# Patient Record
Sex: Female | Born: 1990 | Race: White | Hispanic: No | Marital: Single
Health system: Southern US, Community
[De-identification: ages and names within clinical notes are randomized; demographics above are authoritative.]

---

## 2014-03-08 ENCOUNTER — Ambulatory Visit: Payer: Self-pay | Admitting: Family Medicine

## 2014-03-08 ENCOUNTER — Observation Stay: Payer: Self-pay | Admitting: Surgery

## 2014-03-08 LAB — PREGNANCY, URINE: Pregnancy Test, Urine: NEGATIVE m[IU]/mL

## 2014-03-10 LAB — PATHOLOGY REPORT

## 2014-12-03 NOTE — H&P (Signed)
History of Present Illness [age] yof, [employer] student, who began having epigastric pain Sunday [facility]. Able to sleep Sunday PM, but not last PM. Now pain has migrated to RLQ. No anorexia, no nausea, no fever. Last meal was yesterday.   ALLERGIES:  No Known Allergies:   HOME MEDICATIONS: Medication Instructions Status  Kariva biphasic oral tablet 1 tab(s) orally once a day Active   Family and Social History:  Family History Non-Contributory   Social History negative tobacco, negative ETOH, negative Illicit drugs, [place], single, she has spoken to her parents   Place of Living Home   Review of Systems:  Fever/Chills No   Cough No   Sputum No   Abdominal Pain Yes   Diarrhea No   Constipation No   Nausea/Vomiting No   SOB/DOE No   Chest Pain No   Dysuria No   Tolerating PT Yes   Tolerating Diet Yes   Medications/Allergies Reviewed Medications/Allergies reviewed   Physical Exam:  GEN well developed, well nourished, no acute distress   HEENT pink conjunctivae, PERRL, hearing intact to voice, Oropharynx clear, good dentition   NECK supple   RESP normal resp effort  clear BS  no use of accessory muscles   CARD regular rate  no murmur  no JVD  no Rub   ABD positive tenderness  mild pain, RLQ, no rebound, no guarding   LYMPH negative neck   EXTR negative cyanosis/clubbing, negative edema   SKIN normal to palpation, skin turgor poor   NEURO cranial nerves intact, negative tremor, follows commands, motor/sensory function intact   PSYCH alert, A+O to time, place, person, good insight   Radiology Results: LabUnknown:    28-Jul-15 14:39, CT Abdomen and Pelvis With Contrast  PACS Image  CT:  CT Abdomen and Pelvis With Contrast  REASON FOR EXAM:    Call Report  [id number]  RLQ pain x 2 days with mild   guarding elev white count  COMMENTS:       PROCEDURE: CT  - CT ABDOMEN / PELVIS  W  - Mar 08 2014  2:39PM     CLINICAL DATA:  Right  lower quadrant abdominal pain, guarding, and  elevated white blood cell count.    EXAM:  CT ABDOMEN AND PELVIS WITH CONTRAST    TECHNIQUE:  Multidetector CT imaging of the abdomen and pelvis was performed  using the standard protocol following bolus administration of  intravenous contrast.    CONTRAST:  100 cc of Isovue-300 intravenously. The patient also  received oral contrast material.    COMPARISON:  None    FINDINGS:  The appendix is inflamed and edematous. The maximal measured  diameter i s 13 mm. There is increased density in the surrounding  fat. There is no free fluid or abscess. There is no small or large  bowel obstruction. There are numerous normal-sized to minimally  enlarged mesenteric lymph nodes.    The liver, gallbladder, pancreas, spleen, adrenal glands, and  kidneys arenormal. The caliber of the abdominal aorta is normal.  Within the pelvis the urinary bladder and uterus and adnexal  structures are within the limits of normal for age. There is no  inguinal nor umbilical hernia.    The lung bases are clear. The lumbar spine and bony pelvis are  unremarkable.     IMPRESSION:  The findings are consistent with acute appendicitis without abscess  formation or perforation.    These results will be called to  the ordering clinician or  representative by the Radiology Department at the imaging location.  Electronically Signed    By: David  [place]    On: 03/08/2014 14:48         Verified By: DAVID A. [place], M.D., MD    Assessment/Admission Diagnosis Acute appendicitis   Plan Lap appy   Electronic Signatures: Claude Manges (MD)  (Signed 28-Jul-15 16:46)  Authored: CHIEF COMPLAINT and HISTORY, ALLERGIES, HOME MEDICATIONS, FAMILY AND SOCIAL HISTORY, REVIEW OF SYSTEMS, PHYSICAL EXAM, Radiology, ASSESSMENT AND PLAN   Last Updated: 28-Jul-15 16:46 by Claude Manges (MD)

## 2014-12-03 NOTE — Op Note (Signed)
PATIENT NAME:  Deanna IvoryROBERTS, Velvia A MR#:  [id number] DATE OF BIRTH:  10-05-1990  DATE OF PROCEDURE:  03/08/2014  PREOPERATIVE DIAGNOSIS: Acute appendicitis.   POSTOPERATIVE DIAGNOSIS: Acute early appendicitis.   PROCEDURE PERFORMED: Laparoscopic cholecystectomy.   SURGEON: Raynald KempMark A Rihana Kiddy, M.D.   ASSISTANT: Scrub tech.   TYPE OF ANESTHESIA: General endotracheal.   FINDINGS: Acute appendicitis, early. No apparent evidence of perforation. Normal tubes and ovaries. Normal terminal ileum and liver.   DESCRIPTION OF PROCEDURE: With informed consent, supine position, general endotracheal anesthesia, the patient's abdomen was widely prepped and draped with ChloraPrep solution. Timeout was observed. A 12 mm blunt Hasson trocar was placed through an open technique through an infraumbilical transversely oriented skin incision, with stay sutures being passed through the fascia. Pneumoperitoneum was established. The patient was then positioned in Trendelenburg and planned right side up. A 5 mm bladeless trocar was placed in the right upper quadrant. A 5 mm bladeless trocar was placed in the left lower quadrant.   The appendix was  retrocecal in nature. 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 The base of the appendix was identified. The mesoappendix was then sequentially taken with small bites of the Harmonic scalpel apparatus. A window was fashioned between the appendiceal artery and the base of the appendix. This was taken with the harmonic scalpel with advanced hemostasis feature. The confluence of the tinea was identified. The base of the appendix was then transected off the cecum at the confluence of the tinea, utilizing an endoscopic 35 mm blue load application of the endoscopic stapler. Hemostasis was then obtained on the staple line with point cautery and the application of a small piece of Surgicel. The specimen was captured and  retrieved through the infraumbilical port site.   Pneumoperitoneum was then re-established, the right lower quadrant being irrigated with a total of 1 liter of normal saline and aspirated dry. Generalized laparoscopic evaluation of the abdomen demonstrated normal tubes and ovaries, both the right and left, normal-appearing uterus, normal-appearing left and right lobe of the liver, normal-appearing terminal ileum and cecum.   The ports were then removed under direct visualization. A total of 30 mL of 0.25% plain Marcaine was infiltrated along all skin and fascial incisions prior to closure. The infraumbilical fascial defect was closed with an additional figure-of-eight #0 Vicryl suture in vertical orientation. Stay sutures being tied to each other, 4-0 Vicryl subcuticular was applied to all skin edges, followed by benzoin, Steri-Strips, Telfa and Tegaderm. The patient was then subsequently extubated and taken to the recovery room in stable and satisfactory condition by anesthesia services.     ____________________________ Redge GainerMark A. Egbert GaribaldiBird, MD mab:cg D: 03/09/2014 01:10:43 ET T: 03/09/2014 02:01:30 ET JOB#: [id number]  cc: Loraine LericheMark A. Egbert GaribaldiBird, MD, <Dictator> Keeghan Mcintire A Errin Whitelaw MD ELECTRONICALLY SIGNED 03/09/2014 7:08

## 2015-02-02 IMAGING — CT CT ABD-PELV W/ CM
2 of 4 series · 17 of 46 positions shown, 19 images · IV contrast (agent unspecified)
Comparison: None

CLINICAL DATA: Right lower quadrant abdominal pain, guarding, and
elevated white blood cell count.

EXAM:
CT ABDOMEN AND PELVIS WITH CONTRAST
TECHNIQUE: Multidetector CT imaging of the abdomen and pelvis was performed
using the standard protocol following bolus administration of
intravenous contrast.
CONTRAST:  100 cc of Zsovue-HSS intravenously. The patient also
received oral contrast material.

[Series 2: routine abd pel with · axial · 0.62mm/px · z∈[-935,-570]mm · 14 of 81 slices shown, 16 images]
[im 4/81  soft-tissue]
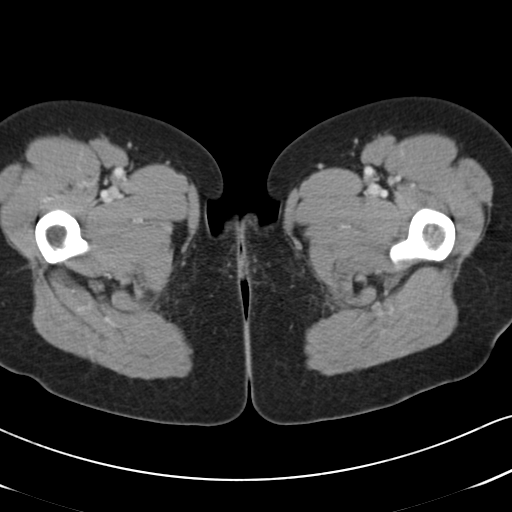
[im 4/81  bone]
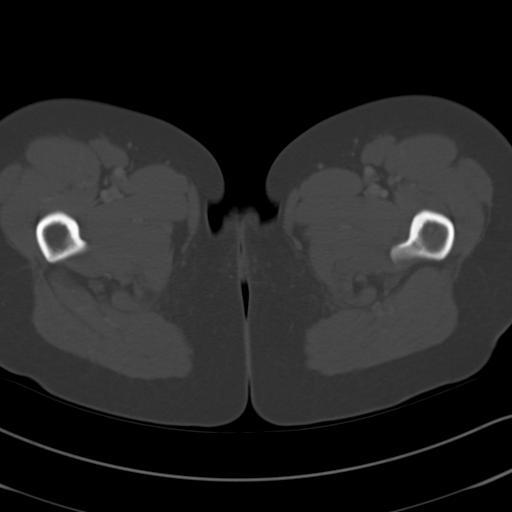
[im 11/81  soft-tissue]
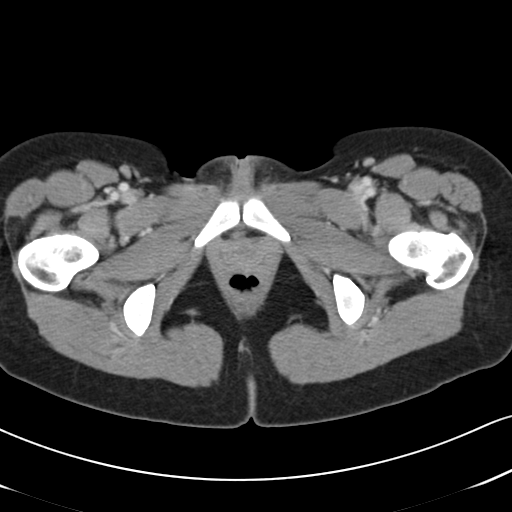
[im 17/81  soft-tissue]
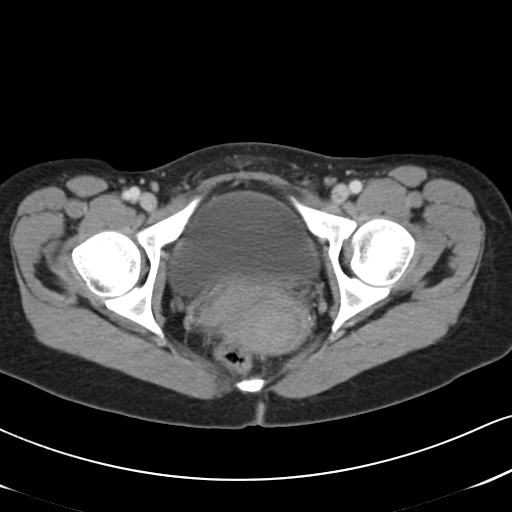
[im 21/81  soft-tissue]
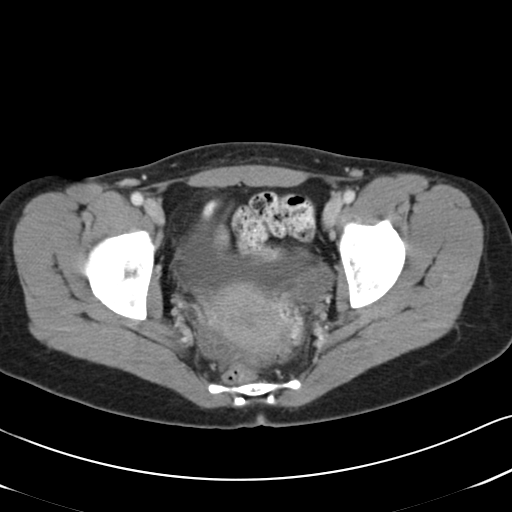
[im 27/81  soft-tissue]
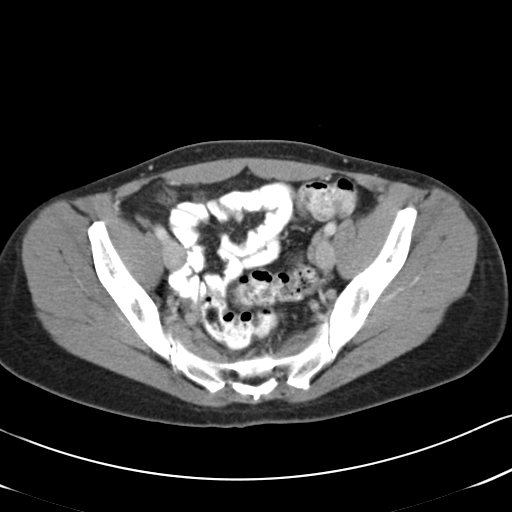
[im 34/81  soft-tissue]
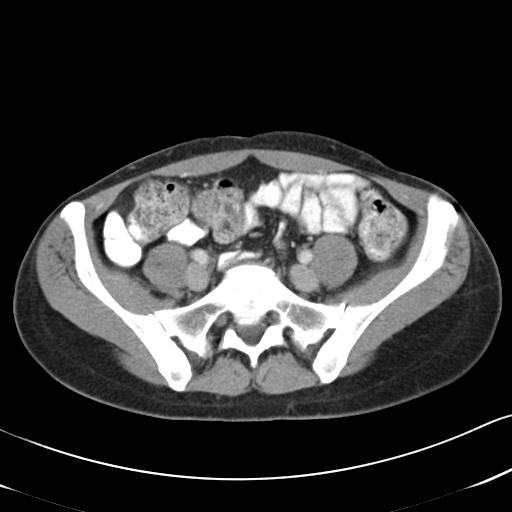
[im 37/81  soft-tissue]
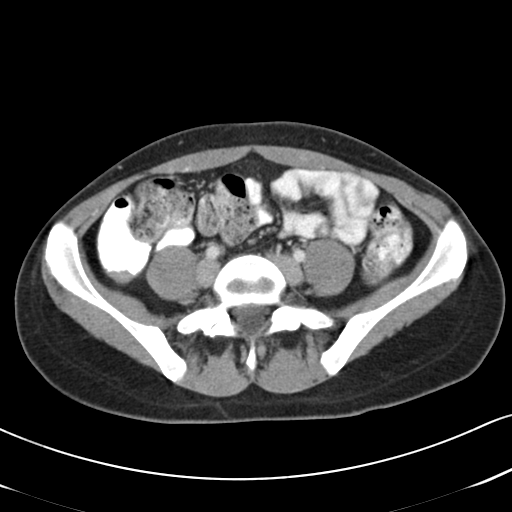
[im 44/81  soft-tissue]
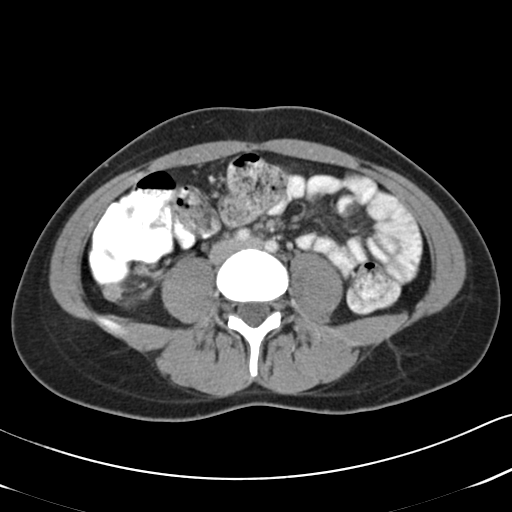
[im 47/81  soft-tissue]
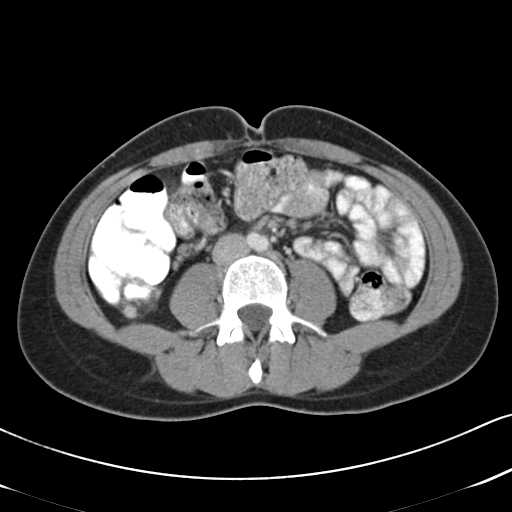
[im 47/81  bone]
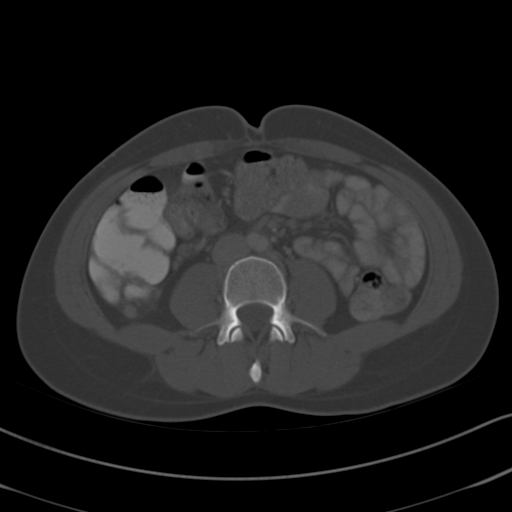
[im 54/81  soft-tissue]
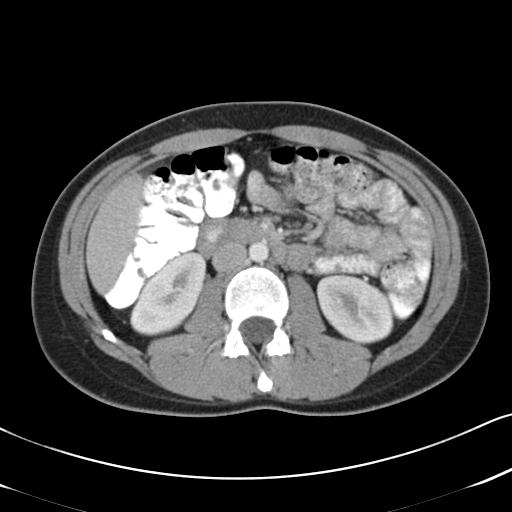
[im 61/81  soft-tissue]
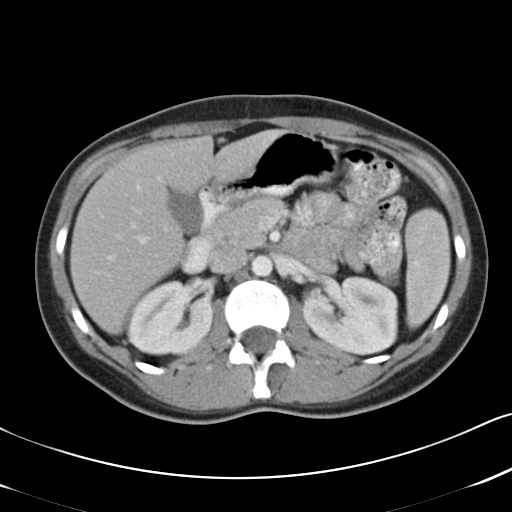
[im 64/81  soft-tissue]
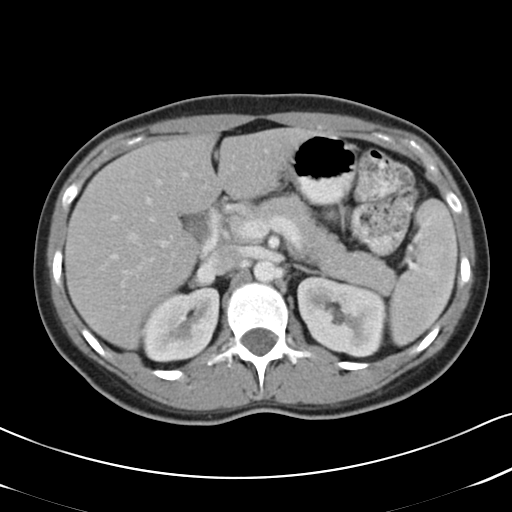
[im 71/81  soft-tissue]
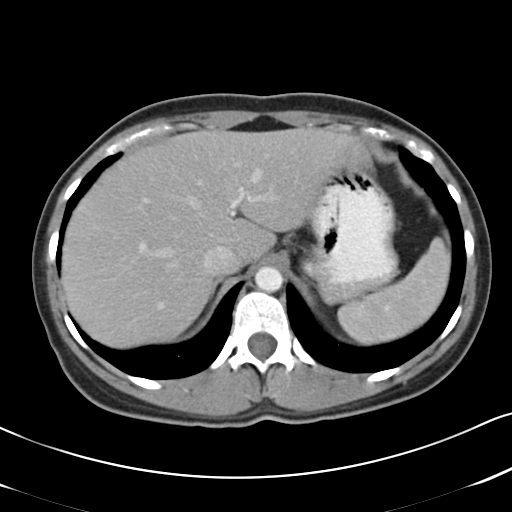
[im 77/81  soft-tissue]
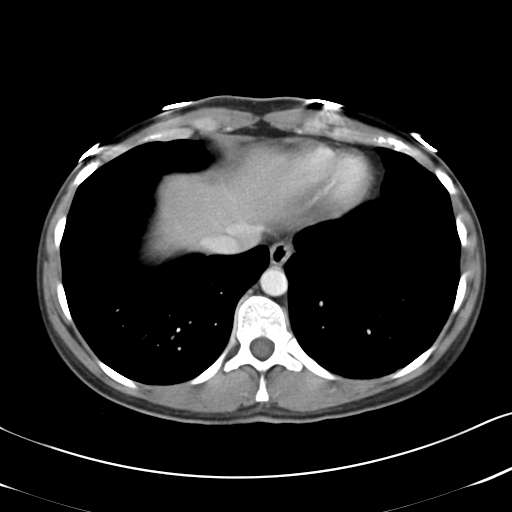

[Series 5: cor routine abd pel with · coronal · 0.67mm/px · 3 of 113 slices shown]
[im 38/113  soft-tissue]
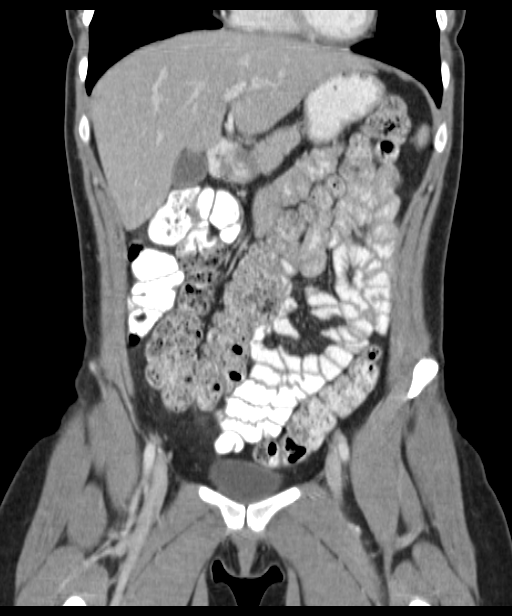
[im 50/113  soft-tissue]
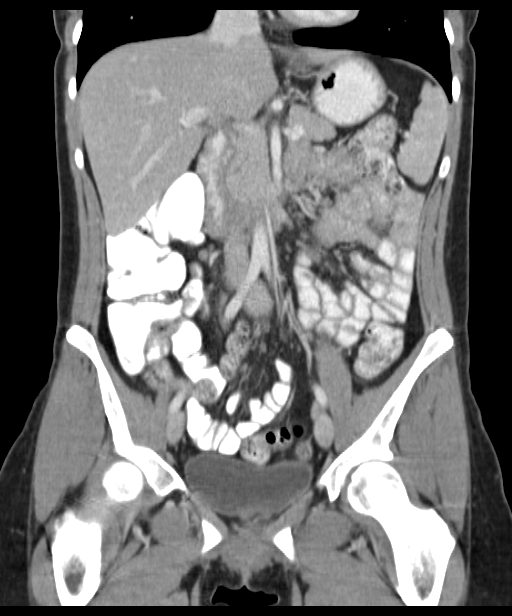
[im 63/113  soft-tissue]
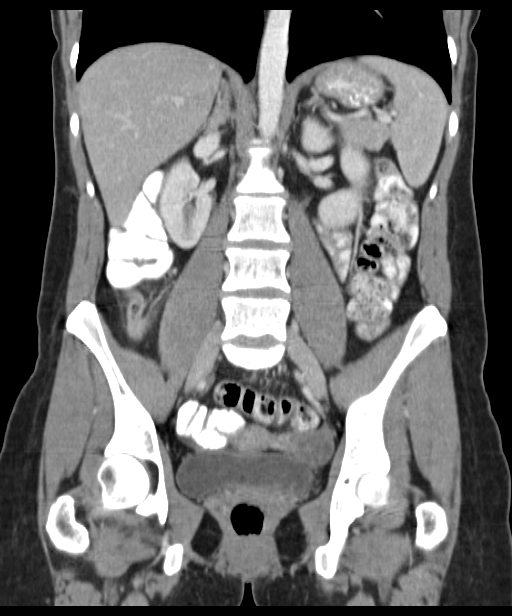

[17 of 46 positions shown; findings below may reference images not displayed]

FINDINGS: The appendix is inflamed and edematous. The maximal measured
diameter i s 13 mm. There is increased density in the surrounding
fat. There is no free fluid or abscess. There is no small or large
bowel obstruction. There are numerous normal-sized to minimally
enlarged mesenteric lymph nodes.

The liver, gallbladder, pancreas, spleen, adrenal glands, and
kidneys are normal. The caliber of the abdominal aorta is normal.
Within the pelvis the urinary bladder and uterus and adnexal
structures are within the limits of normal for age. There is no
inguinal nor umbilical hernia.

The lung bases are clear. The lumbar spine and bony pelvis are
unremarkable.
IMPRESSION: The findings are consistent with acute appendicitis without abscess
formation or perforation.

These results will be called to the ordering clinician or
representative by the [HOSPITAL] at the imaging location.

## 2015-02-06 ENCOUNTER — Telehealth: Payer: Self-pay | Admitting: Obstetrics and Gynecology

## 2015-02-06 MED ORDER — DESOGESTREL-ETHINYL ESTRADIOL 0.15-0.02/0.01 MG (21/5) PO TABS: 1.0000 | ORAL_TABLET | Freq: Every day | ORAL | Status: AC

## 2015-02-06 NOTE — Telephone Encounter (Signed)
Pt aware erx ocp with 1 refill.

## 2015-02-06 NOTE — Telephone Encounter (Signed)
PT IS AN [employer] STUDENT SAW LO LAST YEAR FOR CONTRACEPTION, SHE HAS GRADUATED FROM [employer] AND HAS MOVED AWAY, SHE DOES HAVE A [employer] APPT NEXT MONTH BUT NEEDS A 1 MONTH REFILL ON HER BC THAT [facility] PRESCRIBED HER TO LAST HER UNTIL SHE IS SEEN NEXT MONTH AT HER [employer]. SHE USES [facility] [place], ITS IN [address], SHE TAKES THE [place].

## 2015-02-12 ENCOUNTER — Other Ambulatory Visit: Payer: Self-pay | Admitting: Obstetrics and Gynecology

## 2015-02-14 NOTE — Telephone Encounter (Signed)
Can pt have refill?
